# Patient Record
Sex: Male | Born: 1990 | Race: White | Hispanic: No | Marital: Single
Health system: Southern US, Community
[De-identification: ages and names within clinical notes are randomized; demographics above are authoritative.]

---

## 2012-08-25 ENCOUNTER — Ambulatory Visit: Payer: Self-pay | Admitting: Surgery

## 2012-08-25 LAB — BASIC METABOLIC PANEL
Anion Gap: 6 — ABNORMAL LOW (ref 7–16)
BUN: 11 mg/dL (ref 9–21)
Co2: 29 mmol/L — ABNORMAL HIGH (ref 16–25)
Creatinine: 0.97 mg/dL (ref 0.60–1.30)
EGFR (African American): >60
EGFR (Non-African Amer.): >60
Glucose: 92 mg/dL (ref 65–99)
Sodium: 141 mmol/L (ref 132–141)

## 2012-08-25 LAB — CBC WITH DIFFERENTIAL/PLATELET
Lymphocyte %: 24.6 %
Monocyte %: 8.8 %
Platelet: 174 10*3/uL (ref 150–440)
RBC: 5.48 10*6/uL (ref 4.40–5.90)
RDW: 13 % (ref 11.5–14.5)
WBC: 6.5 10*3/uL (ref 3.8–10.6)

## 2013-01-19 ENCOUNTER — Ambulatory Visit: Payer: Self-pay | Admitting: Family Medicine

## 2013-09-10 DEATH — deceased

## 2013-09-22 IMAGING — CT CT HEAD WITHOUT CONTRAST
1 series · 15 of 30 positions shown, 19 images · non-contrast
Comparison: none

REASON FOR EXAM: CALL REPORT [phone] Headaches when awakening
progressively worsening
COMMENTS:

PROCEDURE:     SPIRIT - APPIAHKUBI VIVY WITHOUT CONTRAST  - January 19, 2013 [DATE]
RESULT:     Comparison:  None
TECHNIQUE: Multiple axial images from the foramen magnum to the vertex were
obtained without IV contrast.

[Series 2: soft tissue · axial · 0.39mm/px · z∈[-100,+45]mm · 15 of 33 slices shown, 19 images]
[im 2/33  brain]
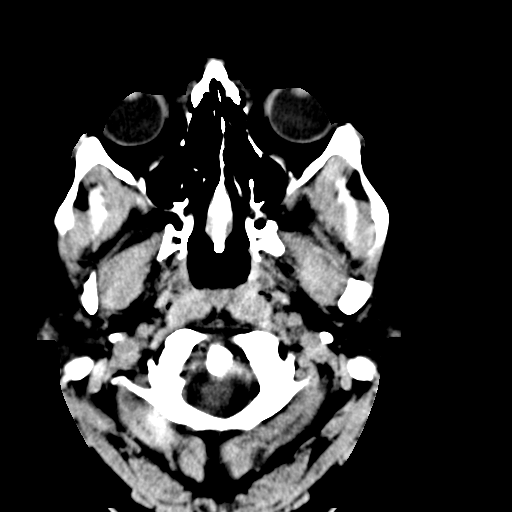
[im 2/33  bone]
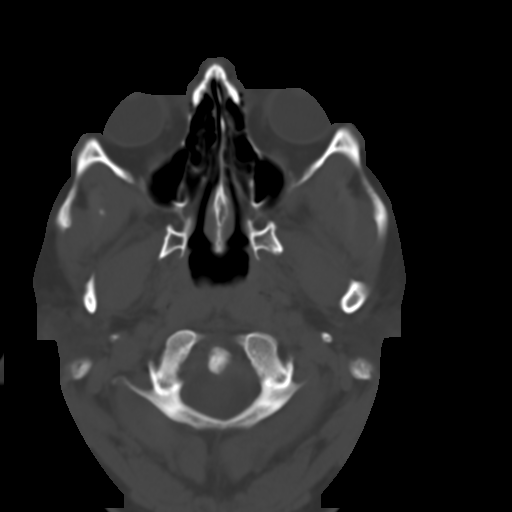
[im 4/33  brain]
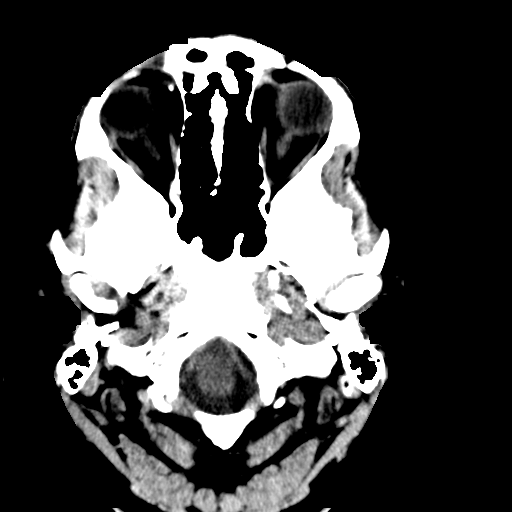
[im 6/33  brain]
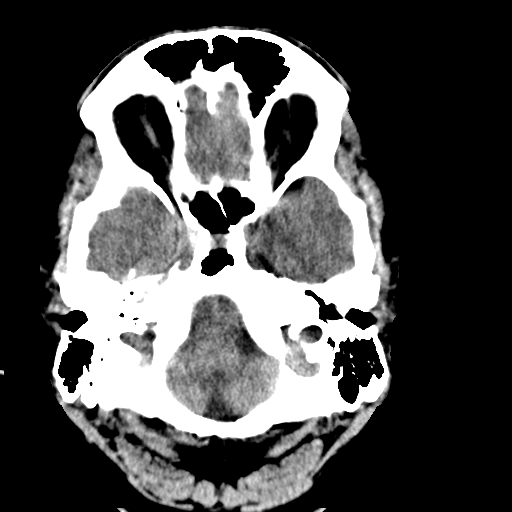
[im 8/33  brain]
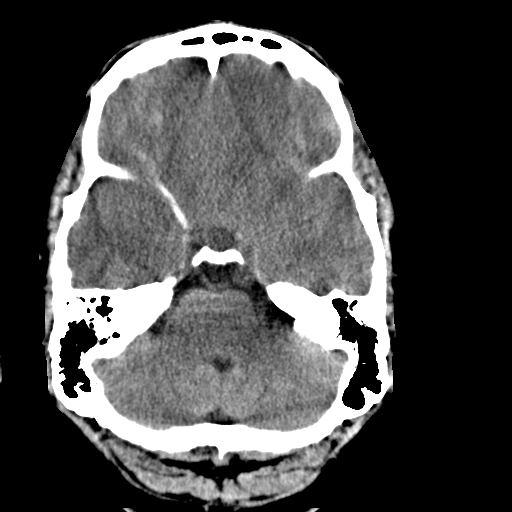
[im 10/33  brain]
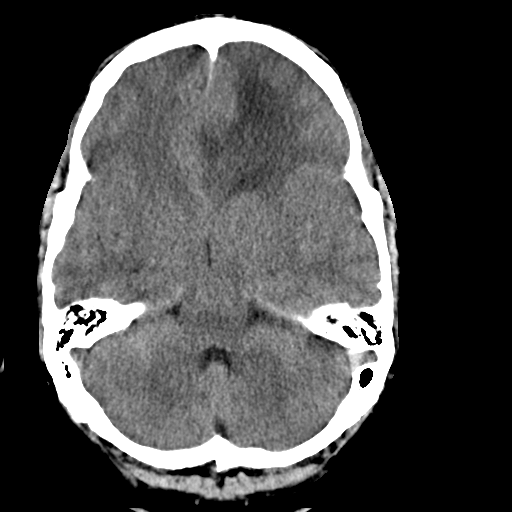
[im 10/33  bone]
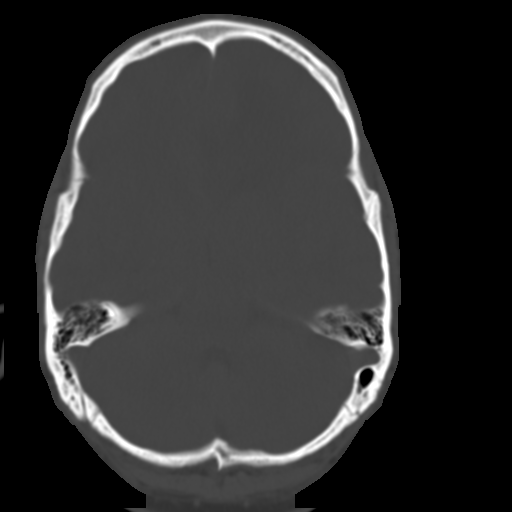
[im 13/33  brain]
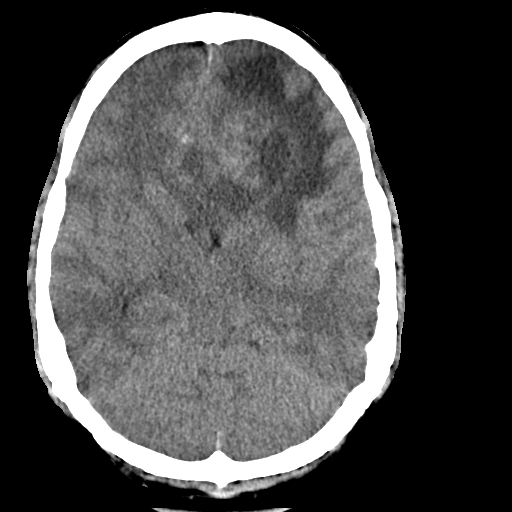
[im 15/33  brain]
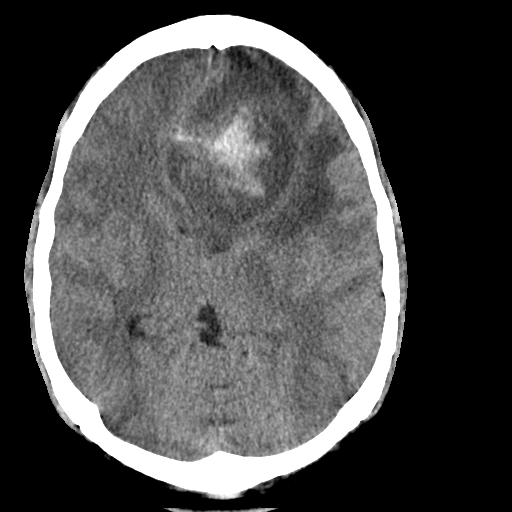
[im 17/33  brain]
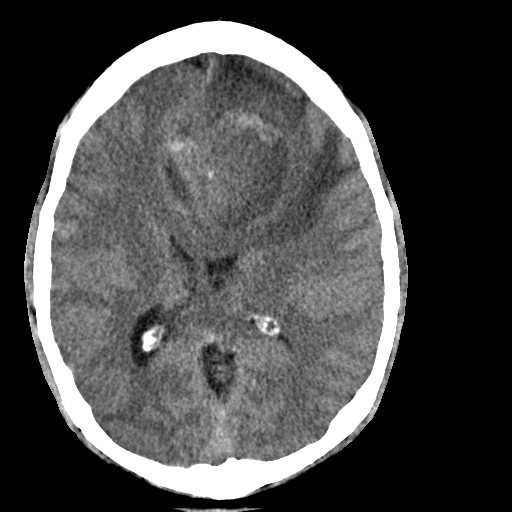
[im 18/33  brain]
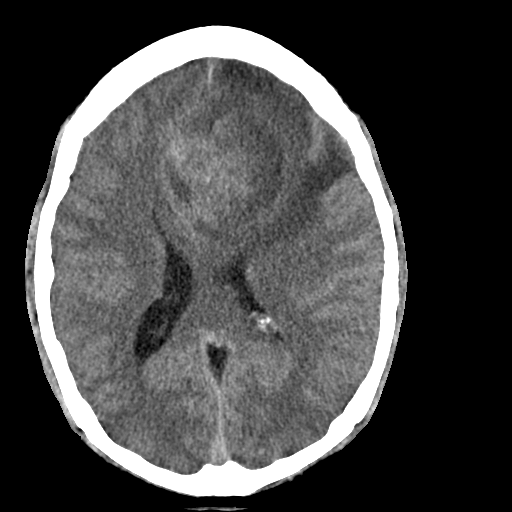
[im 18/33  bone]
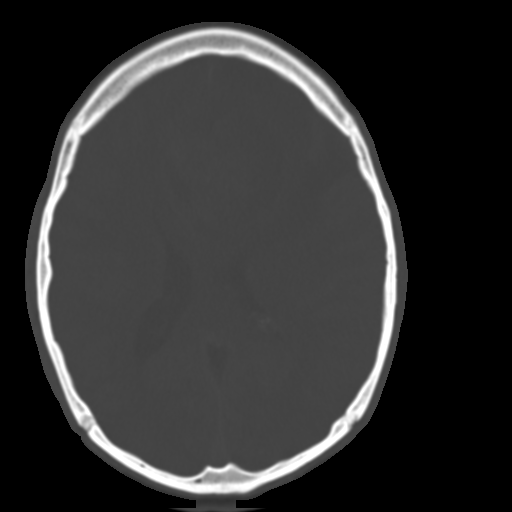
[im 20/33  brain]
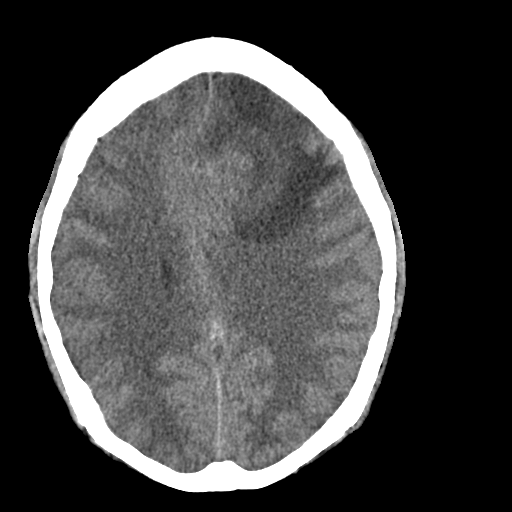
[im 23/33  brain]
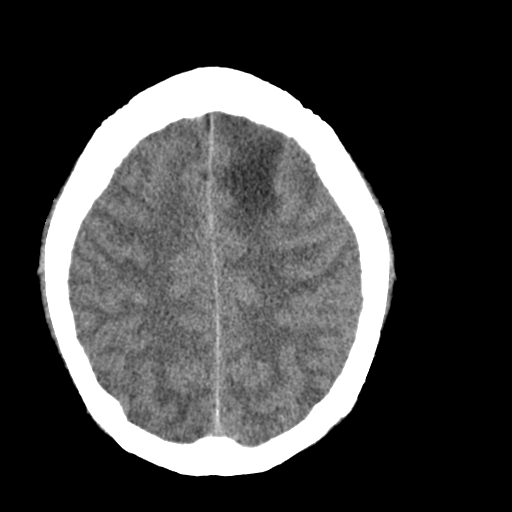
[im 25/33  brain]
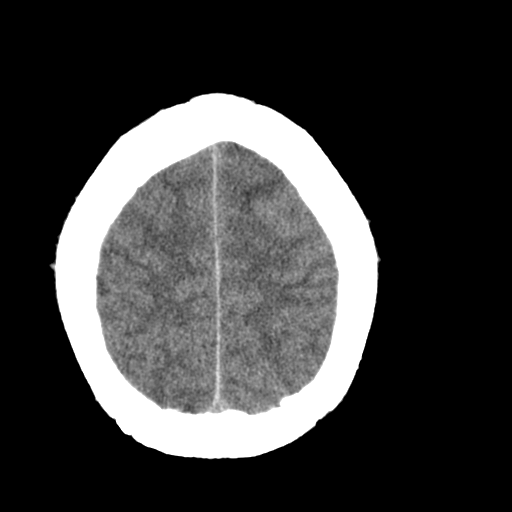
[im 27/33  brain]
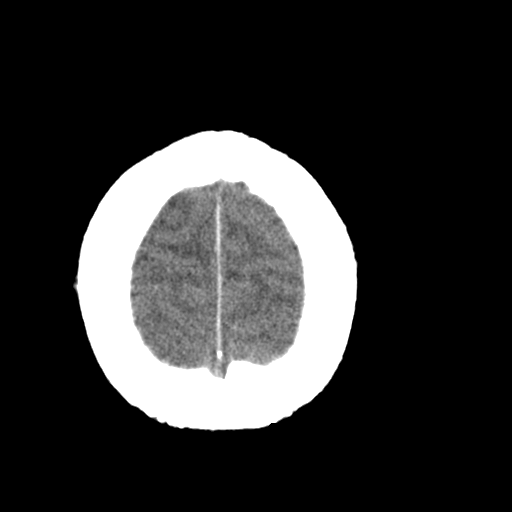
[im 27/33  bone]
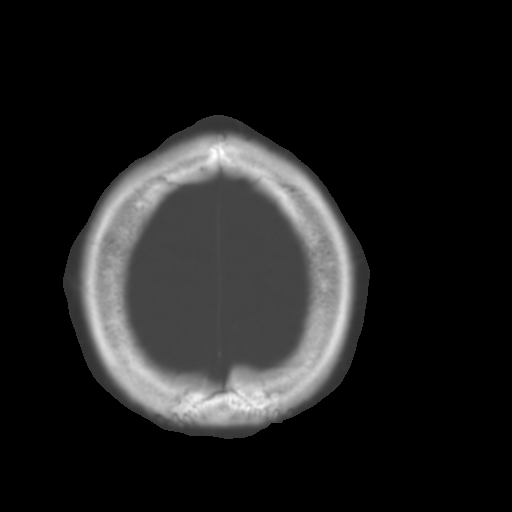
[im 29/33  brain]
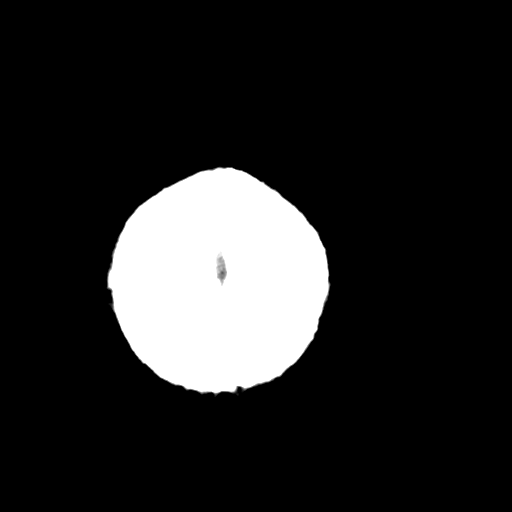
[im 31/33  brain]
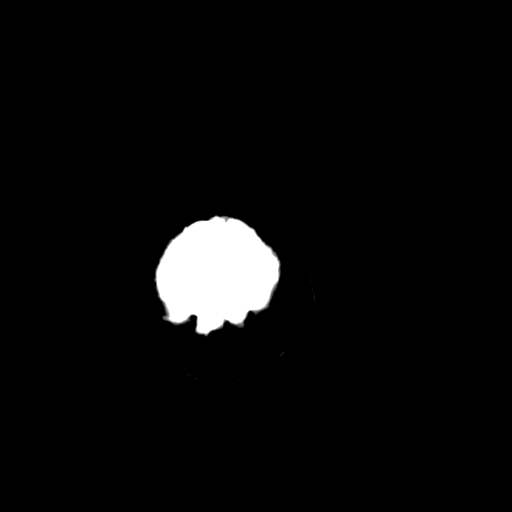

[15 of 30 positions shown; findings below may reference images not displayed]

FINDINGS: There is a large area of hypoattenuation in the left frontal lobe concerning
for a large mass with adjacent vasogenic edema. There is hyperdensity within
the mass concerning for hemorrhage. The size of the mass is not well
evaluated on this noncontrast CT, but measures approximately 5.0 x 5.0 cm.
There is mass effect on the lateral ventricles with effacement of the
anterior horns of the lateral ventricles. There is approximately 5 mm of
midline shift to the right.

The osseous structures are unremarkable.
IMPRESSION: Large hemorrhagic mass in the left frontal lobe with associated vasogenic
edema. There is mass effect and flattening of the anterior horn of the
lateral ventricles as well is midline shift to the right. Neurosurgical
consultation and contrast enhanced brain MRI are recommended.

This was called to the ordering clinician immediately after the dictation.

## 2015-02-27 NOTE — Op Note (Signed)
PATIENT NAME:  Juan Farmer, Juan Farmer MR#:  [id number] DATE OF BIRTH:  02/14/1991  DATE OF PROCEDURE:  08/25/2012  PREOPERATIVE DIAGNOSIS: Symptomatic left inguinal hernia.   POSTOPERATIVE DIAGNOSIS: Symptomatic left inguinal hernia.   PROCEDURE: Laparoscopic transabdominal preperitoneal repair of a symptomatic left inguinal hernia.   SURGEON: Remee Charley E. Excell Seltzer, MD  ASSISTANT: Elizebeth Brooking, PA-S  ANESTHESIA: General with endotracheal tube.   INDICATIONS: Patient with long-standing left groin with signs of a left inguinal hernia. Preoperative we discussed rationale for surgery, the options of observation, risk of bleeding, infection, recurrence, ischemic orchitis, chronic pain syndrome, and neuroma. This is all reviewed for he and his mother in the preop holding area. They understood and agreed to proceed.   FINDINGS: Large long-standing indirect sac with a strong floor. The peritoneum had torn when the dissecting balloon was inflated therefore a transabdominal approach was utilized rather than a totally extraperitoneal approach, which was planned.   DESCRIPTION OF PROCEDURE: Patient was induced to general anesthesia, given IV antibiotics. A Foley catheter was placed. He was then prepped and draped in sterile fashion. Marcaine was infiltrated in skin and subcutaneous tissues around the periumbilical area. Incision was made. Dissection down to the left rectus fascia was performed. It was incised and the muscle retracted laterally. The [place] surgical dissecting balloon was placed and inflated and in so doing, it was noted that the peritoneum had torn fairly cephalad near the umbilicus and that the balloon was inflated intra-abdominally. The balloon was removed and then the structural balloon was placed and inflated inside the abdomen. The abdominal cavity was explored. There was no sign of adhesions and no sign of bowel injury. Because of the event of being intra-abdominal a transabdominal approach was then  performed as follows.   Under direct vision two midline 5 mm ports were placed. The large indirect sac going through the internal ring was noted and photographed. It was everted utilizing instruments and then using sharp dissection without the use of electrocautery in peritoneum cephalad was incised and retracted posteriorly and caudad allowing for dissection of the hernia sac off of the internal ring and off of the cord. Medially Keymani Mclean's ligament was delineated. The peritoneum was taken down not quite to the midline but the midline pubic tubercle was easily visible and palpable.   Once the cord was skeletonized of the indirect sac which was retracted cephalad, the laterally scissored Atrium mesh was placed into the abdominal cavity and then into the preperitoneal space. It was held in place with the [place] surgical tacker tacking to Carlie Solorzano's ligament and anteriorly on the abdominal wall, avoiding tacks laterally. There was one tack placed lateral and cephalad avoiding the area of the nerve.   With the large amount of the lax peritoneum the patient was taken out of Trendelenburg position and this allowed for tacking of the peritoneum back together and at the termination of the procedure there was no mesh visible and there was adequate lax peritoneum covering the mesh.   Under direct vision all ports were removed. Fascial edges at the periumbilical site were approximated with figure-of-eight 0 Vicryl and then 4-0 subcuticular Monocryl was used at all skin edges. Steri-Strips, Mastisol, and sterile dressings were placed.   Patient tolerated the procedure well. There were no complications. He was taken to the recovery room in stable condition to be discharged in care of his family. Follow up in 10 days.  ____________________________ Adah Salvage Excell Seltzer, MD rec:cms D: 08/25/2012 13:47:29 ET T: 08/25/2012 14:08:41 ET JOB#:  [id number]  cc: Adah Salvageichard E. Excell Seltzerooper, MD, <Dictator> Lattie HawICHARD E Vincenta Steffey MD ELECTRONICALLY  SIGNED 08/30/2012 20:09
